# Patient Record
Sex: Female | Born: 1935 | Hispanic: No | Marital: Single
Health system: Southern US, Community
[De-identification: ages and names within clinical notes are randomized; demographics above are authoritative.]

---

## 2016-08-30 ENCOUNTER — Inpatient Hospital Stay
Admission: AD | Admit: 2016-08-30 | Discharge: 2016-09-18 | Disposition: A | Discharge status: Skilled Nursing Facility | Payer: Medicare Other | Source: Other Acute Inpatient Hospital | Attending: Internal Medicine | Admitting: Internal Medicine

## 2016-08-30 DIAGNOSIS — J9690 Respiratory failure, unspecified, unspecified whether with hypoxia or hypercapnia: Secondary | ICD-10-CM

## 2016-08-31 LAB — COMPREHENSIVE METABOLIC PANEL
ALT: 10 U/L — ABNORMAL LOW (ref 14–54)
AST: 21 U/L (ref 15–41)
Albumin: 2.6 g/dL — ABNORMAL LOW (ref 3.5–5.0)
Alkaline Phosphatase: 121 U/L (ref 38–126)
Anion gap: 12 (ref 5–15)
BUN: 19 mg/dL (ref 6–20)
CO2: 29 mmol/L (ref 22–32)
Calcium: 9.1 mg/dL (ref 8.9–10.3)
Chloride: 97 mmol/L — ABNORMAL LOW (ref 101–111)
Creatinine, Ser: 3.68 mg/dL — ABNORMAL HIGH (ref 0.44–1.00)
GFR calc Af Amer: 12 mL/min — ABNORMAL LOW (ref >60)
GFR calc non Af Amer: 11 mL/min — ABNORMAL LOW (ref >60)
Glucose, Bld: 240 mg/dL — ABNORMAL HIGH (ref 65–99)
Potassium: 3.5 mmol/L (ref 3.5–5.1)
Sodium: 138 mmol/L (ref 135–145)
Total Bilirubin: 0.8 mg/dL (ref 0.3–1.2)
Total Protein: 6.2 g/dL — ABNORMAL LOW (ref 6.5–8.1)

## 2016-08-31 LAB — CBC
HCT: 36.1 % (ref 36.0–46.0)
Hemoglobin: 11.4 g/dL — ABNORMAL LOW (ref 12.0–15.0)
MCH: 30.5 pg (ref 26.0–34.0)
MCHC: 31.6 g/dL (ref 30.0–36.0)
MCV: 96.5 fL (ref 78.0–100.0)
Platelets: 122 10*3/uL — ABNORMAL LOW (ref 150–400)
RBC: 3.74 MIL/uL — ABNORMAL LOW (ref 3.87–5.11)
RDW: 17.5 % — ABNORMAL HIGH (ref 11.5–15.5)
WBC: 7.2 10*3/uL (ref 4.0–10.5)

## 2016-09-01 ENCOUNTER — Other Ambulatory Visit (HOSPITAL_COMMUNITY): Payer: Medicare Other

## 2016-09-01 LAB — TSH: TSH: 2.214 u[IU]/mL (ref 0.350–4.500)

## 2016-09-02 LAB — IRON AND TIBC
Iron: 25 ug/dL — ABNORMAL LOW (ref 28–170)
Saturation Ratios: 12 % (ref 10.4–31.8)
TIBC: 204 ug/dL — ABNORMAL LOW (ref 250–450)
UIBC: 179 ug/dL

## 2016-09-02 LAB — FERRITIN: Ferritin: 522 ng/mL — ABNORMAL HIGH (ref 11–307)

## 2016-09-02 LAB — TRANSFERRIN: TRANSFERRIN: 146 mg/dL — AB (ref 192–382)

## 2016-09-02 NOTE — Consult Note (Signed)
Date: 09/02/2016                  Patient Name:  Annette BaliRosa Yu  MRN: [id number]  DOB: Sep 25, 1932  Age / Sex: 83 y.o., female         PCP: No primary care provider on file.                 [facility]ing Consult[facility]                 Reason for Consult: ESRD            History of Present Illness: Patient is a 83 y.o. female with medical problems of End-stage renal disease on dialysis since 2 years, history of four-vessel bypass in 2004,  nonhealing diabetic foot ulcers bilaterally, [facility], [facility] .   Patient is not able to provide any meaningful information. All information is obtained from the chart as well as from her daughter. She states that patient suffered a cardiac arrest during a lower extremity angiogram. She was then transferred to ICU, stepped down and then to this hospital for further care. She has history of ischemic cardiomyopathy with LVEF 30-35%. Patient is very somnolent at present. She just received pain medications. Her nurse reports that she did eat a full breakfast this morning this morning  [facility] by Dr. Orrin BrighamJasani. She has a right forearm AV fistula. She goes to dialysis Monday, Wednesday, Friday. Her last dialysis was Friday.   Medications: Outpatient medications: No prescriptions prior to admission.    Current medications: No current facility-administered medications for this encounter.     Aspirin 81 mg daily PhosLo 667 mg 3 times a day Gabapentin 300 mg twice a day Heparin 5000 units subcutaneous Q8 hours Isosorbide mononitrate 30 mg every morning Lantus 10 mg each bedtime Pantoprazole once a day Prednisone 5 mg daily Vitamin D 2000 u daily   Allergies: Allergies not on file  No known allergies  Past Medical History: No past medical history on file. As above  Past Surgical History: No past surgical  history on file. As above  Family History: No family history on file. noncontributory  Social History: family contact patient's daughter, Cala BradfordKimberly Social History   Social History  . Marital status: Single    Spouse name: N/A  . Number of children: N/A  . Years of education: N/A   Occupational History  . Not on file.   Social History Main Topics  . Smoking status: Not on file  . Smokeless tobacco: Not on file  . Alcohol use Not on file  . Drug use: Unknown  . Sexual activity: Not on file   Other Topics Concern  . Not on file   Social History Narrative  . No narrative on file     Review of Systems: not available due to patient being extremely somnolent Gen:  HEENT:  CV:  Resp:  GI: GU :  MS:  Derm:   Psych: Heme:  Neuro:  Endocrine  Vital Signs: There were no vitals taken for this visit.  No intake or output data in the 24 hours ending 09/02/16 1610  Weight trends: There were no vitals filed for this visit.  Physical Exam: General:  no acute distress, laying in the bed  HEENT Eyes closed, moist mucous membranes  Neck:  supple  Lungs: Normal breathing effort,  clear anteriorly and laterally  Heart::  no rub or gallop  Abdomen: Soft, nontender  Extremities: Bilateral feet in soft support  Neurologic: Somnolent, did not follow commands  Skin: no acute rashes  Access: Right forearm AV fistula          Lab results: Basic Metabolic Panel:  Recent Labs Lab 08/31/16 0706  NA 138  K 3.5  CL 97*  CO2 29  GLUCOSE 240*  BUN 19  CREATININE 3.68*  CALCIUM 9.1    Liver Function Tests:  Recent Labs Lab 08/31/16 0706  AST 21  ALT 10*  ALKPHOS 121  BILITOT 0.8  PROT 6.2*  ALBUMIN 2.6*   No results for input(s): LIPASE, AMYLASE in the last 168 hours. No results for input(s): AMMONIA in the last 168 hours.  CBC:  Recent Labs Lab 08/31/16 0706  WBC 7.2  HGB 11.4*  HCT 36.1  MCV 96.5  PLT 122*    Cardiac Enzymes: No results  for input(s): CKTOTAL, TROPONINI in the last 168 hours.  BNP: Invalid input(s): POCBNP  CBG: No results for input(s): GLUCAP in the last 168 hours.  Microbiology: No results found for this or any previous visit (from the past 720 hour(s)).   Coagulation Studies: No results for input(s): LABPROT, INR in the last 72 hours.  Urinalysis: No results for input(s): COLORURINE, LABSPEC, PHURINE, GLUCOSEU, HGBUR, BILIRUBINUR, KETONESUR, PROTEINUR, UROBILINOGEN, NITRITE, LEUKOCYTESUR in the last 72 hours.  Invalid input(s): APPERANCEUR      Imaging: Dg Chest Port 1 View  Result Date: 09/01/2016 CLINICAL DATA:  Respiratory failure EXAM: PORTABLE CHEST 1 VIEW COMPARISON:  None. FINDINGS: Cardiac shadow is enlarged. Postsurgical changes are noted. Mild central vascular congestion is noted without significant pulmonary edema. Some minimal left basilar atelectasis is noted. No bony abnormality is seen. IMPRESSION: Mild congestive failure and left basilar atelectasis. Electronically Signed   By: Alcide Clever M.D.   On: 09/01/2016 08:13      Assessment & Plan: Pt is a 83 y.o. African American female with medical problems of End-stage renal disease on dialysis for 2 years, history of four-vessel bypass in 2004,  nonhealing diabetic foot ulcers bilaterally, [facility], [facility] .    [facility] Dr Orrin Brigham  1. End stage renal disease 2. Anemia of chronic kidney disease 3. Secondary hyperparathyroidism 4. Peripheral vascular disease, bilateral lower extremity nonhealing diabetic foot ulcers  Plan: We will place her on a dosage regimen Monday, Wednesday, Friday We will obtain iron studies We will monitor her phosphorus closely Will follow  1. End-stage renal disease

## 2016-09-03 LAB — BASIC METABOLIC PANEL
Anion gap: 9 (ref 5–15)
BUN: 22 mg/dL — AB (ref 6–20)
CALCIUM: 8.8 mg/dL — AB (ref 8.9–10.3)
CO2: 29 mmol/L (ref 22–32)
Chloride: 98 mmol/L — ABNORMAL LOW (ref 101–111)
Creatinine, Ser: 3.53 mg/dL — ABNORMAL HIGH (ref 0.44–1.00)
GFR calc Af Amer: 13 mL/min — ABNORMAL LOW (ref >60)
GFR, EST NON AFRICAN AMERICAN: 11 mL/min — AB (ref >60)
GLUCOSE: 69 mg/dL (ref 65–99)
POTASSIUM: 4 mmol/L (ref 3.5–5.1)
SODIUM: 136 mmol/L (ref 135–145)

## 2016-09-03 LAB — PARATHYROID HORMONE, INTACT (NO CA): PTH: 43 pg/mL (ref 15–65)

## 2016-09-03 LAB — HEPATITIS B CORE ANTIBODY, IGM: Hep B C IgM: NEGATIVE

## 2016-09-03 LAB — HEPATITIS B SURFACE ANTIBODY, QUANTITATIVE: HEPATITIS B-POST: 5.9 m[IU]/mL — AB

## 2016-09-03 LAB — HEPATITIS B E ANTIBODY: HEP B E AB: NEGATIVE

## 2016-09-04 LAB — RENAL FUNCTION PANEL
ALBUMIN: 2.5 g/dL — AB (ref 3.5–5.0)
ANION GAP: 10 (ref 5–15)
BUN: 33 mg/dL — ABNORMAL HIGH (ref 6–20)
CO2: 30 mmol/L (ref 22–32)
Calcium: 9.2 mg/dL (ref 8.9–10.3)
Chloride: 96 mmol/L — ABNORMAL LOW (ref 101–111)
Creatinine, Ser: 4.43 mg/dL — ABNORMAL HIGH (ref 0.44–1.00)
GFR calc Af Amer: 10 mL/min — ABNORMAL LOW (ref >60)
GFR calc non Af Amer: 9 mL/min — ABNORMAL LOW (ref >60)
GLUCOSE: 105 mg/dL — AB (ref 65–99)
PHOSPHORUS: 2.7 mg/dL (ref 2.5–4.6)
POTASSIUM: 4.1 mmol/L (ref 3.5–5.1)
Sodium: 136 mmol/L (ref 135–145)

## 2016-09-04 LAB — CBC
HEMATOCRIT: 29.9 % — AB (ref 36.0–46.0)
HEMOGLOBIN: 9.4 g/dL — AB (ref 12.0–15.0)
MCH: 30 pg (ref 26.0–34.0)
MCHC: 31.4 g/dL (ref 30.0–36.0)
MCV: 95.5 fL (ref 78.0–100.0)
Platelets: 165 10*3/uL (ref 150–400)
RBC: 3.13 MIL/uL — ABNORMAL LOW (ref 3.87–5.11)
RDW: 16.8 % — ABNORMAL HIGH (ref 11.5–15.5)
WBC: 5.2 10*3/uL (ref 4.0–10.5)

## 2016-09-04 NOTE — Consult Note (Signed)
Subjective: More awake today Moaning Decreased hearing  Vital Signs:   T 98.2  P 80  R 14 BP 108/60  Weight trends: There were no vitals filed for this visit.  Physical Exam: General:    laying in the bed  HEENT Eyes closed, moist mucous membranes  Neck:  supple  Lungs: Normal breathing effort, diminished at bases  Heart::  no rub or gallop  Abdomen: Soft, nontender  Extremities: Bilateral feet in soft support  Neurologic: Somnolent,  Decreased hearing  Skin: no acute rashes  Access: Right forearm AV fistula          Lab results: Basic Metabolic Panel:  Recent Labs Lab 08/31/16 0706 09/03/16 0810 09/04/16 0939  NA 138 136 136  K 3.5 4.0 4.1  CL 97* 98* 96*  CO2 29 29 30   GLUCOSE 240* 69 105*  BUN 19 22* 33*  CREATININE 3.68* 3.53* 4.43*  CALCIUM 9.1 8.8* 9.2  PHOS  --   --  2.7    Liver Function Tests:  Recent Labs Lab 08/31/16 0706 09/04/16 0939  AST 21  --   ALT 10*  --   ALKPHOS 121  --   BILITOT 0.8  --   PROT 6.2*  --   ALBUMIN 2.6* 2.5*   No results for input(s): LIPASE, AMYLASE in the last 168 hours. No results for input(s): AMMONIA in the last 168 hours.  CBC:  Recent Labs Lab 08/31/16 0706 09/04/16 0939  WBC 7.2 5.2  HGB 11.4* 9.4*  HCT 36.1 29.9*  MCV 96.5 95.5  PLT 122* 165    Cardiac Enzymes: No results for input(s): CKTOTAL, TROPONINI in the last 168 hours.  BNP: Invalid input(s): POCBNP  CBG: No results for input(s): GLUCAP in the last 168 hours.  Microbiology: No results found for this or any previous visit (from the past 720 hour(s)).   Coagulation Studies: No results for input(s): LABPROT, INR in the last 72 hours.  Urinalysis: No results for input(s): COLORURINE, LABSPEC, PHURINE, GLUCOSEU, HGBUR, BILIRUBINUR, KETONESUR, PROTEINUR, UROBILINOGEN, NITRITE, LEUKOCYTESUR in the last 72 hours.  Invalid input(s): APPERANCEUR      Imaging: No results found.   Assessment & Plan: Pt is a 83 y.o. African  American female with medical problems of End-stage renal disease on dialysis for 2 years, history of four-vessel bypass in 2004,  nonhealing diabetic foot ulcers bilaterally, [facility], [facility] .    [facility] Dr Orrin BrighamJasani  1. End stage renal disease 2. Anemia of chronic kidney disease 3. Secondary hyperparathyroidism 4. Peripheral vascular disease, bilateral lower extremity nonhealing diabetic foot ulcers  Plan: We will place her on a dialysis regimen Monday, Wednesday, Friday t sat 12%, Ferritin 522 We will monitor her phosphorus closely Will follow

## 2016-09-06 LAB — CBC
HEMATOCRIT: 29.1 % — AB (ref 36.0–46.0)
Hemoglobin: 9.3 g/dL — ABNORMAL LOW (ref 12.0–15.0)
MCH: 30 pg (ref 26.0–34.0)
MCHC: 32 g/dL (ref 30.0–36.0)
MCV: 93.9 fL (ref 78.0–100.0)
PLATELETS: 167 10*3/uL (ref 150–400)
RBC: 3.1 MIL/uL — AB (ref 3.87–5.11)
RDW: 16.4 % — ABNORMAL HIGH (ref 11.5–15.5)
WBC: 4.9 10*3/uL (ref 4.0–10.5)

## 2016-09-06 LAB — RENAL FUNCTION PANEL
Albumin: 2.4 g/dL — ABNORMAL LOW (ref 3.5–5.0)
Anion gap: 10 (ref 5–15)
BUN: 20 mg/dL (ref 6–20)
CHLORIDE: 97 mmol/L — AB (ref 101–111)
CO2: 29 mmol/L (ref 22–32)
CREATININE: 2.3 mg/dL — AB (ref 0.44–1.00)
Calcium: 8.8 mg/dL — ABNORMAL LOW (ref 8.9–10.3)
GFR calc non Af Amer: 19 mL/min — ABNORMAL LOW (ref >60)
GFR, EST AFRICAN AMERICAN: 22 mL/min — AB (ref >60)
Glucose, Bld: 90 mg/dL (ref 65–99)
POTASSIUM: 3.8 mmol/L (ref 3.5–5.1)
Phosphorus: 2 mg/dL — ABNORMAL LOW (ref 2.5–4.6)
Sodium: 136 mmol/L (ref 135–145)

## 2016-09-06 NOTE — Consult Note (Signed)
Subjective: Patient seen during dialysis Tolerating well  BFR 400/ DFR 800   Vital Signs:   T 97.6  P 83  R 18  BP 117/61   Physical Exam: General:    laying in the bed  HEENT Eyes closed, moist mucous membranes  Neck:  supple  Lungs: Normal breathing effort, diminished at bases  Heart::  no rub or gallop  Abdomen: Firm, mild diffuse tenderness  Extremities: Bilateral feet in soft support  Neurologic: Somnolent,  Decreased hearing  Skin: no acute rashes  Access: Right forearm AV fistula          Lab results: Basic Metabolic Panel:  Recent Labs Lab 08/31/16 0706 09/03/16 0810 09/04/16 0939  NA 138 136 136  K 3.5 4.0 4.1  CL 97* 98* 96*  CO2 29 29 30   GLUCOSE 240* 69 105*  BUN 19 22* 33*  CREATININE 3.68* 3.53* 4.43*  CALCIUM 9.1 8.8* 9.2  PHOS  --   --  2.7    Liver Function Tests:  Recent Labs Lab 08/31/16 0706 09/04/16 0939  AST 21  --   ALT 10*  --   ALKPHOS 121  --   BILITOT 0.8  --   PROT 6.2*  --   ALBUMIN 2.6* 2.5*   No results for input(s): LIPASE, AMYLASE in the last 168 hours. No results for input(s): AMMONIA in the last 168 hours.  CBC:  Recent Labs Lab 08/31/16 0706 09/04/16 0939  WBC 7.2 5.2  HGB 11.4* 9.4*  HCT 36.1 29.9*  MCV 96.5 95.5  PLT 122* 165    Cardiac Enzymes: No results for input(s): CKTOTAL, TROPONINI in the last 168 hours.  BNP: Invalid input(s): POCBNP  CBG: No results for input(s): GLUCAP in the last 168 hours.  Microbiology: No results found for this or any previous visit (from the past 720 hour(s)).   Coagulation Studies: No results for input(s): LABPROT, INR in the last 72 hours.  Urinalysis: No results for input(s): COLORURINE, LABSPEC, PHURINE, GLUCOSEU, HGBUR, BILIRUBINUR, KETONESUR, PROTEINUR, UROBILINOGEN, NITRITE, LEUKOCYTESUR in the last 72 hours.  Invalid input(s): APPERANCEUR      Imaging: No results found.   Assessment & Plan: Pt is a 83 y.o. African American female with  medical problems of End-stage renal disease on dialysis for 2 years, history of four-vessel bypass in 2004,  nonhealing diabetic foot ulcers bilaterally, [facility], [facility] .    [facility] Dr Orrin BrighamJasani  1. End stage renal disease 2. Anemia of chronic kidney disease 3. Secondary hyperparathyroidism 4. Peripheral vascular disease, bilateral lower extremity nonhealing diabetic foot ulcers  Plan: We will place her on a dialysis regimen Monday, Wednesday, Friday t sat 12%, Ferritin 522 We will monitor her phosphorus closely Will follow

## 2016-09-08 LAB — RENAL FUNCTION PANEL
Albumin: 2.6 g/dL — ABNORMAL LOW (ref 3.5–5.0)
Anion gap: 11 (ref 5–15)
BUN: 43 mg/dL — AB (ref 6–20)
CALCIUM: 9.5 mg/dL (ref 8.9–10.3)
CHLORIDE: 97 mmol/L — AB (ref 101–111)
CO2: 27 mmol/L (ref 22–32)
CREATININE: 3.53 mg/dL — AB (ref 0.44–1.00)
GFR calc non Af Amer: 11 mL/min — ABNORMAL LOW (ref >60)
GFR, EST AFRICAN AMERICAN: 13 mL/min — AB (ref >60)
GLUCOSE: 172 mg/dL — AB (ref 65–99)
Phosphorus: 3.5 mg/dL (ref 2.5–4.6)
Potassium: 4.8 mmol/L (ref 3.5–5.1)
SODIUM: 135 mmol/L (ref 135–145)

## 2016-09-08 LAB — CBC
HCT: 33.1 % — ABNORMAL LOW (ref 36.0–46.0)
Hemoglobin: 10.4 g/dL — ABNORMAL LOW (ref 12.0–15.0)
MCH: 30 pg (ref 26.0–34.0)
MCHC: 31.4 g/dL (ref 30.0–36.0)
MCV: 95.4 fL (ref 78.0–100.0)
PLATELETS: 177 10*3/uL (ref 150–400)
RBC: 3.47 MIL/uL — AB (ref 3.87–5.11)
RDW: 16.2 % — AB (ref 11.5–15.5)
WBC: 4.3 10*3/uL (ref 4.0–10.5)

## 2016-09-09 NOTE — Consult Note (Signed)
Subjective: Patient seen during dialysis Tolerating well  BFR 400/ DFR 800 Sitting up in chair   Vital Signs:   T 98.6  P 89  R 18  BP 116/59  Gen: sitting up in chair Chest: normal effort, room air Extr: no edema Neuro: able to follow simple commands   Lab results: Basic Metabolic Panel:  Recent Labs Lab 09/04/16 0939 09/06/16 0500 09/08/16 0620  NA 136 136 135  K 4.1 3.8 4.8  CL 96* 97* 97*  CO2 30 29 27   GLUCOSE 105* 90 172*  BUN 33* 20 43*  CREATININE 4.43* 2.30* 3.53*  CALCIUM 9.2 8.8* 9.5  PHOS 2.7 2.0* 3.5    Liver Function Tests:  Recent Labs Lab 09/08/16 0620  ALBUMIN 2.6*   No results for input(s): LIPASE, AMYLASE in the last 168 hours. No results for input(s): AMMONIA in the last 168 hours.  CBC:  Recent Labs Lab 09/06/16 0500 09/08/16 0620  WBC 4.9 4.3  HGB 9.3* 10.4*  HCT 29.1* 33.1*  MCV 93.9 95.4  PLT 167 177    Cardiac Enzymes: No results for input(s): CKTOTAL, TROPONINI in the last 168 hours.  BNP: Invalid input(s): POCBNP  CBG: No results for input(s): GLUCAP in the last 168 hours.  Microbiology: No results found for this or any previous visit (from the past 720 hour(s)).   Coagulation Studies: No results for input(s): LABPROT, INR in the last 72 hours.  Urinalysis: No results for input(s): COLORURINE, LABSPEC, PHURINE, GLUCOSEU, HGBUR, BILIRUBINUR, KETONESUR, PROTEINUR, UROBILINOGEN, NITRITE, LEUKOCYTESUR in the last 72 hours.  Invalid input(s): APPERANCEUR      Imaging: No results found.   Assessment & Plan: Pt is a 83 y.o. African American female with medical problems of End-stage renal disease on dialysis for 2 years, history of four-vessel bypass in 2004,  nonhealing diabetic foot ulcers bilaterally, [facility], [facility] .    [facility] Dr Orrin BrighamJasani  1. End stage renal disease 2. Anemia of chronic kidney disease 3. Secondary  hyperparathyroidism 4. Peripheral vascular disease, bilateral lower extremity nonhealing diabetic foot ulcers  Plan: Continue dialysis Monday, Wednesday, Friday t sat 12%, Ferritin 522 We will monitor her phosphorus closely Will follow

## 2016-09-11 LAB — CBC
HEMATOCRIT: 31.1 % — AB (ref 36.0–46.0)
HEMOGLOBIN: 9.8 g/dL — AB (ref 12.0–15.0)
MCH: 29.7 pg (ref 26.0–34.0)
MCHC: 31.5 g/dL (ref 30.0–36.0)
MCV: 94.2 fL (ref 78.0–100.0)
Platelets: 221 10*3/uL (ref 150–400)
RBC: 3.3 MIL/uL — ABNORMAL LOW (ref 3.87–5.11)
RDW: 16.6 % — ABNORMAL HIGH (ref 11.5–15.5)
WBC: 6.4 10*3/uL (ref 4.0–10.5)

## 2016-09-11 LAB — RENAL FUNCTION PANEL
ALBUMIN: 2.5 g/dL — AB (ref 3.5–5.0)
ANION GAP: 10 (ref 5–15)
BUN: 39 mg/dL — ABNORMAL HIGH (ref 6–20)
CHLORIDE: 98 mmol/L — AB (ref 101–111)
CO2: 27 mmol/L (ref 22–32)
Calcium: 9.4 mg/dL (ref 8.9–10.3)
Creatinine, Ser: 3.76 mg/dL — ABNORMAL HIGH (ref 0.44–1.00)
GFR calc Af Amer: 12 mL/min — ABNORMAL LOW (ref >60)
GFR, EST NON AFRICAN AMERICAN: 10 mL/min — AB (ref >60)
Glucose, Bld: 56 mg/dL — ABNORMAL LOW (ref 65–99)
PHOSPHORUS: 3.6 mg/dL (ref 2.5–4.6)
POTASSIUM: 4.9 mmol/L (ref 3.5–5.1)
Sodium: 135 mmol/L (ref 135–145)

## 2016-09-13 LAB — CBC
HCT: 30.4 % — ABNORMAL LOW (ref 36.0–46.0)
Hemoglobin: 9.7 g/dL — ABNORMAL LOW (ref 12.0–15.0)
MCH: 30 pg (ref 26.0–34.0)
MCHC: 31.9 g/dL (ref 30.0–36.0)
MCV: 94.1 fL (ref 78.0–100.0)
Platelets: 228 10*3/uL (ref 150–400)
RBC: 3.23 MIL/uL — ABNORMAL LOW (ref 3.87–5.11)
RDW: 16.4 % — AB (ref 11.5–15.5)
WBC: 5.3 10*3/uL (ref 4.0–10.5)

## 2016-09-13 LAB — RENAL FUNCTION PANEL
Albumin: 2.6 g/dL — ABNORMAL LOW (ref 3.5–5.0)
Anion gap: 14 (ref 5–15)
BUN: 34 mg/dL — AB (ref 6–20)
CHLORIDE: 96 mmol/L — AB (ref 101–111)
CO2: 25 mmol/L (ref 22–32)
CREATININE: 3.73 mg/dL — AB (ref 0.44–1.00)
Calcium: 9.4 mg/dL (ref 8.9–10.3)
GFR calc Af Amer: 12 mL/min — ABNORMAL LOW (ref >60)
GFR, EST NON AFRICAN AMERICAN: 10 mL/min — AB (ref >60)
GLUCOSE: 77 mg/dL (ref 65–99)
Phosphorus: 3.7 mg/dL (ref 2.5–4.6)
Potassium: 4.6 mmol/L (ref 3.5–5.1)
Sodium: 135 mmol/L (ref 135–145)

## 2016-09-13 NOTE — Consult Note (Signed)
Subjective: Patient seen during dialysis Tolerating well  BFR 400/ DFR 800 Sitting up in chair   Vital Signs:   T 97.8  P 82   BP 114/75  Gen: sitting up in chair Chest: normal effort, room air Extr: no edema Neuro: able to follow simple commands Access: Rt Arm AVF   Lab results: Basic Metabolic Panel:  Recent Labs Lab 09/08/16 0620 09/11/16 0717 09/13/16 1630  NA 135 135 135  K 4.8 4.9 4.6  CL 97* 98* 96*  CO2 27 27 25   GLUCOSE 172* 56* 77  BUN 43* 39* 34*  CREATININE 3.53* 3.76* 3.73*  CALCIUM 9.5 9.4 9.4  PHOS 3.5 3.6 3.7    Liver Function Tests:  Recent Labs Lab 09/13/16 1630  ALBUMIN 2.6*   No results for input(s): LIPASE, AMYLASE in the last 168 hours. No results for input(s): AMMONIA in the last 168 hours.  CBC:  Recent Labs Lab 09/11/16 0715 09/13/16 1630  WBC 6.4 5.3  HGB 9.8* 9.7*  HCT 31.1* 30.4*  MCV 94.2 94.1  PLT 221 228    Cardiac Enzymes: No results for input(s): CKTOTAL, TROPONINI in the last 168 hours.  BNP: Invalid input(s): POCBNP  CBG: No results for input(s): GLUCAP in the last 168 hours.  Microbiology: No results found for this or any previous visit (from the past 720 hour(s)).   Coagulation Studies: No results for input(s): LABPROT, INR in the last 72 hours.  Urinalysis: No results for input(s): COLORURINE, LABSPEC, PHURINE, GLUCOSEU, HGBUR, BILIRUBINUR, KETONESUR, PROTEINUR, UROBILINOGEN, NITRITE, LEUKOCYTESUR in the last 72 hours.  Invalid input(s): APPERANCEUR      Imaging: No results found.   Assessment & Plan: Pt is a 83 y.o. African American female with medical problems of End-stage renal disease on dialysis for 2 years, history of four-vessel bypass in 2004,  nonhealing diabetic foot ulcers bilaterally, [facility], [facility] .    [facility] Dr Orrin BrighamJasani  1. End stage renal disease 2. Anemia of chronic kidney  disease 3. Secondary hyperparathyroidism 4. Peripheral vascular disease, bilateral lower extremity nonhealing diabetic foot ulcers  Plan: Continue dialysis Monday, Wednesday, Friday t sat 12%, Ferritin 522.  We will monitor her phosphorus closely, currently in normal range Will follow

## 2016-09-16 NOTE — Progress Notes (Signed)
  [facility]  ROUNDING NOTE   Subjective:  Patient seen post dialysis. She appears to have tolerated well. Resting comfortably in bed.   Objective:  Vital signs in last 24 hours:  Temperature 98.5 pulse 78 respirations 16 blood pressure 120/57  Physical Exam: General: No acute distress  Head: Normocephalic, atraumatic. Moist oral mucosal membranes  Eyes: Anicteric  Neck: Supple, trachea midline  Lungs:  Clear to auscultation, normal effort  Heart: S1S2 no rubs  Abdomen:  Soft, nontender, BS present  Extremities: No LE edema  Neurologic: Awake, follows simple commands  Skin: No lesions  Access: Rt arm AVF    Basic Metabolic Panel:  Recent Labs Lab 09/11/16 0717 09/13/16 1630  NA 135 135  K 4.9 4.6  CL 98* 96*  CO2 27 25  GLUCOSE 56* 77  BUN 39* 34*  CREATININE 3.76* 3.73*  CALCIUM 9.4 9.4  PHOS 3.6 3.7    Liver Function Tests:  Recent Labs Lab 09/11/16 0717 09/13/16 1630  ALBUMIN 2.5* 2.6*   No results for input(s): LIPASE, AMYLASE in the last 168 hours. No results for input(s): AMMONIA in the last 168 hours.  CBC:  Recent Labs Lab 09/11/16 0715 09/13/16 1630  WBC 6.4 5.3  HGB 9.8* 9.7*  HCT 31.1* 30.4*  MCV 94.2 94.1  PLT 221 228    Cardiac Enzymes: No results for input(s): CKTOTAL, CKMB, CKMBINDEX, TROPONINI in the last 168 hours.  BNP: Invalid input(s): POCBNP  CBG: No results for input(s): GLUCAP in the last 168 hours.  Microbiology: No results found for this or any previous visit.  Coagulation Studies: No results for input(s): LABPROT, INR in the last 72 hours.  Urinalysis: No results for input(s): COLORURINE, LABSPEC, PHURINE, GLUCOSEU, HGBUR, BILIRUBINUR, KETONESUR, PROTEINUR, UROBILINOGEN, NITRITE, LEUKOCYTESUR in the last 72 hours.  Invalid input(s): APPERANCEUR    Imaging: No results found.   Medications:       Assessment/ Plan:  83 y.o. African American female with medical problems of End-stage  renal disease on dialysis for 2 years, history of four-vessel bypass in 2004,  nonhealing diabetic foot ulcers bilaterally, [facility], [facility] .    [facility] Dr Orrin BrighamJasani  1. End stage renal disease 2. Anemia of chronic kidney disease 3. Secondary hyperparathyroidism 4. Peripheral vascular disease, bilateral lower extremity nonhealing diabetic foot ulcers  Plan: Patient seen post dialysis. Ultrafiltration was less than 1 kg today. We will plan for hemodialysis again on Wednesday. No new hemoglobin today. Recheck on Wednesday. We will also plan to reevaluate mono metabolism parameters on Wednesday.   LOS: 0 Yanky Vanderburg 1/22/20184:45 PM

## 2016-09-18 LAB — RENAL FUNCTION PANEL
Albumin: 2.7 g/dL — ABNORMAL LOW (ref 3.5–5.0)
Anion gap: 11 (ref 5–15)
BUN: 31 mg/dL — ABNORMAL HIGH (ref 6–20)
CHLORIDE: 95 mmol/L — AB (ref 101–111)
CO2: 26 mmol/L (ref 22–32)
CREATININE: 3.74 mg/dL — AB (ref 0.44–1.00)
Calcium: 9.3 mg/dL (ref 8.9–10.3)
GFR calc Af Amer: 12 mL/min — ABNORMAL LOW (ref >60)
GFR calc non Af Amer: 10 mL/min — ABNORMAL LOW (ref >60)
GLUCOSE: 159 mg/dL — AB (ref 65–99)
Phosphorus: 3.5 mg/dL (ref 2.5–4.6)
Potassium: 4.9 mmol/L (ref 3.5–5.1)
Sodium: 132 mmol/L — ABNORMAL LOW (ref 135–145)

## 2016-09-18 LAB — CBC
HCT: 31.3 % — ABNORMAL LOW (ref 36.0–46.0)
Hemoglobin: 9.9 g/dL — ABNORMAL LOW (ref 12.0–15.0)
MCH: 29.2 pg (ref 26.0–34.0)
MCHC: 31.6 g/dL (ref 30.0–36.0)
MCV: 92.3 fL (ref 78.0–100.0)
PLATELETS: 193 10*3/uL (ref 150–400)
RBC: 3.39 MIL/uL — AB (ref 3.87–5.11)
RDW: 15.6 % — ABNORMAL HIGH (ref 11.5–15.5)
WBC: 6.1 10*3/uL (ref 4.0–10.5)

## 2016-12-24 DEATH — deceased

## 2017-08-18 IMAGING — CR DG CHEST 1V PORT
1 series · 1 of 1 positions shown · non-contrast
Comparison: None.

CLINICAL DATA: Respiratory failure

EXAM:
PORTABLE CHEST 1 VIEW

[AP]
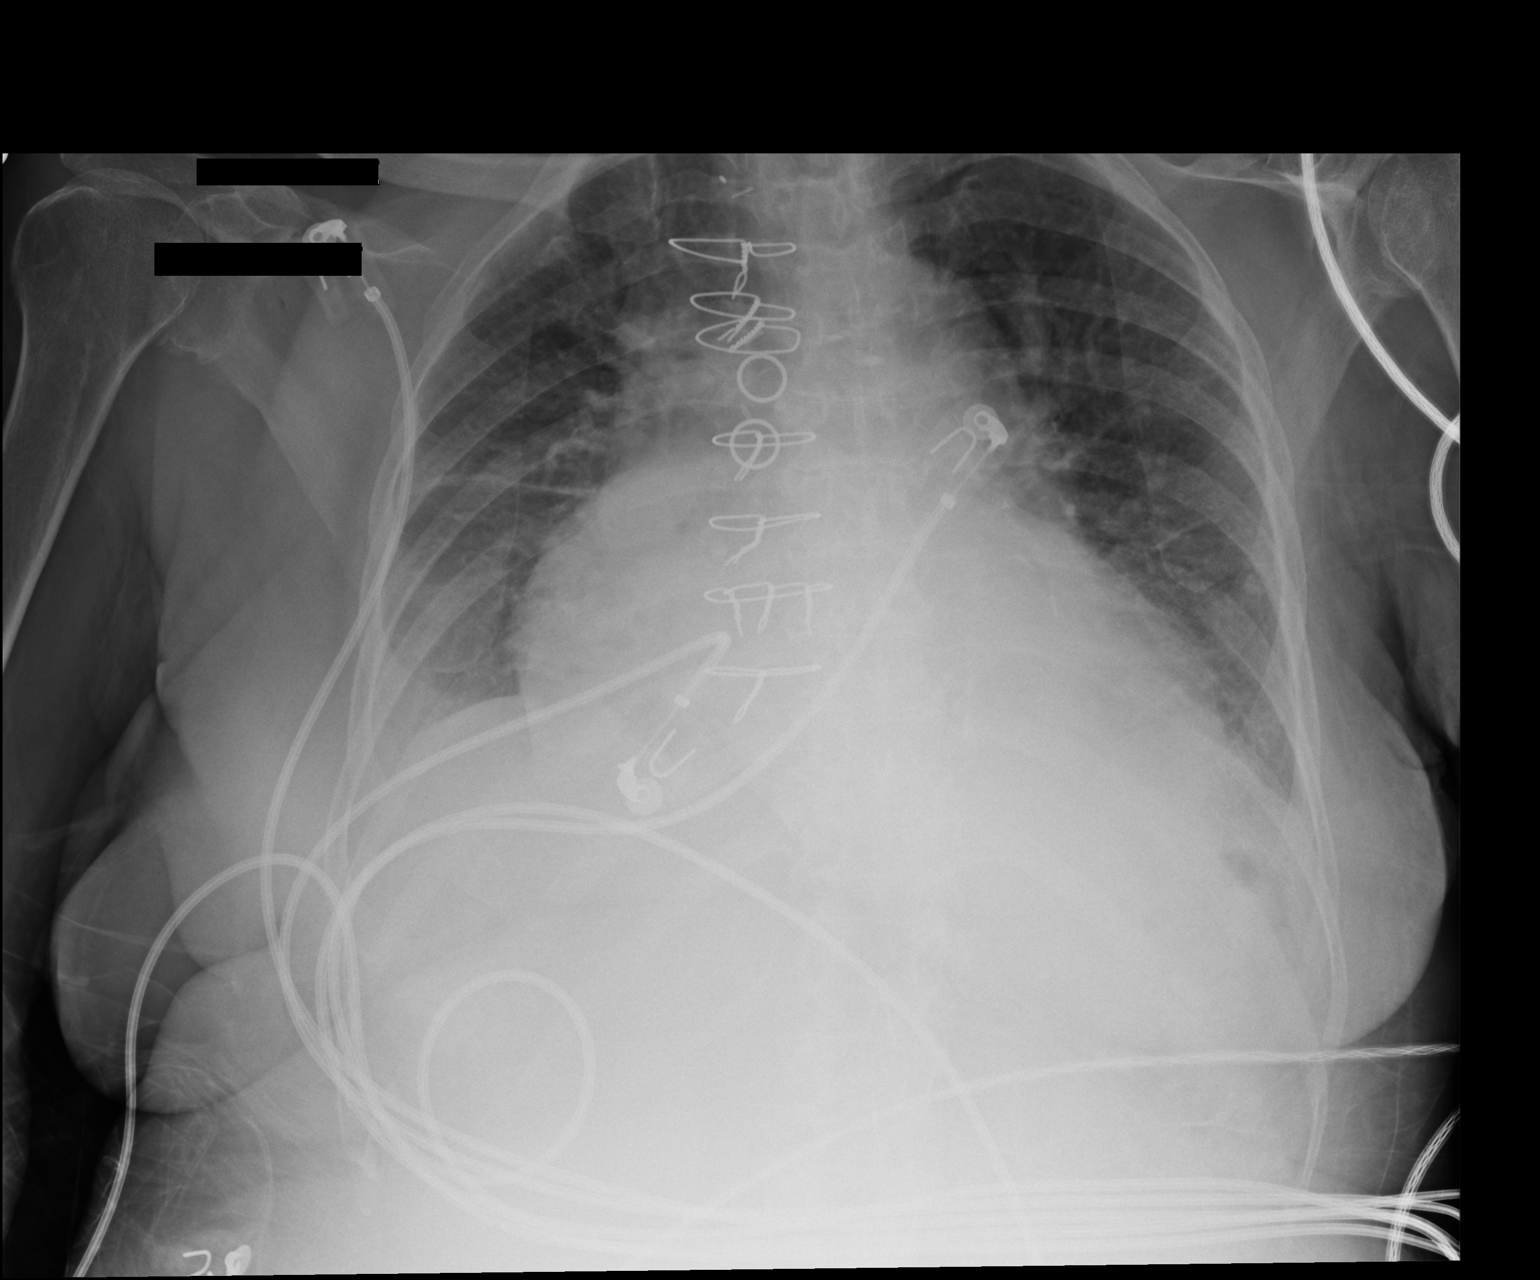

[1 of 1 positions shown; findings below may reference images not displayed]

FINDINGS: Cardiac shadow is enlarged. Postsurgical changes are noted. Mild
central vascular congestion is noted without significant pulmonary
edema. Some minimal left basilar atelectasis is noted. No bony
abnormality is seen.
IMPRESSION: Mild congestive failure and left basilar atelectasis.
